# Patient Record
Sex: Male | Born: 1960 | Race: White | Hispanic: No | Marital: Married | Smoking: Never smoker
Health system: Southern US, Community
[De-identification: ages and names within clinical notes are randomized; demographics above are authoritative.]

## PROBLEM LIST (undated history)

## (undated) HISTORY — PX: LEG SURGERY: SHX1003

---

## 1997-12-12 ENCOUNTER — Ambulatory Visit (HOSPITAL_BASED_OUTPATIENT_CLINIC_OR_DEPARTMENT_OTHER): Admission: RE | Admit: 1997-12-12 | Discharge: 1997-12-12 | Payer: Self-pay | Admitting: *Deleted

## 2001-04-08 ENCOUNTER — Emergency Department (HOSPITAL_COMMUNITY): Admission: EM | Admit: 2001-04-08 | Discharge: 2001-04-09 | Payer: Self-pay | Admitting: Emergency Medicine

## 2001-04-09 ENCOUNTER — Encounter: Payer: Self-pay | Admitting: Emergency Medicine

## 2007-07-31 ENCOUNTER — Emergency Department (HOSPITAL_COMMUNITY): Admission: EM | Admit: 2007-07-31 | Discharge: 2007-07-31 | Payer: Self-pay | Admitting: Emergency Medicine

## 2007-11-19 ENCOUNTER — Emergency Department (HOSPITAL_COMMUNITY): Admission: EM | Admit: 2007-11-19 | Discharge: 2007-11-19 | Payer: Self-pay | Admitting: Emergency Medicine

## 2008-12-02 ENCOUNTER — Emergency Department (HOSPITAL_BASED_OUTPATIENT_CLINIC_OR_DEPARTMENT_OTHER): Admission: EM | Admit: 2008-12-02 | Discharge: 2008-12-02 | Payer: Self-pay | Admitting: Emergency Medicine

## 2010-11-05 ENCOUNTER — Emergency Department (HOSPITAL_BASED_OUTPATIENT_CLINIC_OR_DEPARTMENT_OTHER)
Admission: EM | Admit: 2010-11-05 | Discharge: 2010-11-05 | Disposition: A | Discharge status: Home / Self Care | Payer: Self-pay | Attending: Emergency Medicine | Admitting: Emergency Medicine

## 2010-11-05 ENCOUNTER — Encounter: Payer: Self-pay | Admitting: Family Medicine

## 2010-11-05 DIAGNOSIS — R21 Rash and other nonspecific skin eruption: Secondary | ICD-10-CM | POA: Insufficient documentation

## 2010-11-05 MED ORDER — PREDNISONE 20 MG PO TABS: 40.0000 mg | ORAL_TABLET | Freq: Every day | ORAL | Status: AC

## 2010-11-05 NOTE — ED Provider Notes (Signed)
History     CSN: [id number] Arrival date & time: 11/05/2010  5:48 PM   First MD Initiated Contact with Patient 11/05/10 1737      Chief Complaint  Patient presents with  . Rash    (Consider location/radiation/quality/duration/timing/severity/associated sxs/prior treatment) HPI Comments: Pt states that he has had the areas to the chest and arms:pt states that most of the area has gotten better the next day after benadryl, but it keeps pooping up  Patient is a 50 y.o. male presenting with rash. The history is provided by the patient. No language interpreter was used.  Rash  This is a new problem. There has been no fever.    History reviewed. No pertinent past medical history.  Past Surgical History  Procedure Date  . Leg surgery     No family history on file.  History  Substance Use Topics  . Smoking status: Never Smoker   . Smokeless tobacco: Not on file  . Alcohol Use: No      Review of Systems  Constitutional: Negative.   HENT: Negative.   Respiratory: Negative.   Cardiovascular: Negative.   Musculoskeletal: Negative.   Skin: Positive for rash.  Neurological: Negative.   All other systems reviewed and are negative.    Allergies  Review of patient's allergies indicates no known allergies.  Home Medications   Current Outpatient Rx  Name Route Sig Dispense Refill  . DIPHENHYDRAMINE HCL 25 MG PO TABS Oral Take 50 mg by mouth at bedtime as needed. For itching       There were no vitals taken for this visit.  Physical Exam  Nursing note and vitals reviewed. Constitutional: He appears well-developed and well-nourished.  HENT:  Head: Normocephalic and atraumatic.  Right Ear: External ear normal.  Left Ear: External ear normal.  Mouth/Throat: Oropharynx is clear and moist.  Cardiovascular: Normal rate and regular rhythm.   Pulmonary/Chest: Effort normal and breath sounds normal.  Musculoskeletal: Normal range of motion.  Neurological: He is alert.    Skin:       Pt has red raised papules to the left forearm:no drainage noted  Psychiatric: He has a normal mood and affect.    ED Course  Procedures (including critical care time)  Labs Reviewed - No data to display No results found.   1. Rash       MDM  Pt denies exposure to ticks:discussed with pt that like a reaction to something that he was exposed to with hay:pt has had a cold recently, no concern for stevens Jacobo Forest, NP 11/05/10 1811

## 2010-11-05 NOTE — ED Provider Notes (Signed)
Medical screening examination/treatment/procedure(s) were performed by non-physician practitioner and as supervising physician I was immediately available for consultation/collaboration.  Ethelda Chick, MD 11/05/10 [phone]

## 2010-11-05 NOTE — ED Notes (Signed)
Pt. Has noted red rash with some raised bumps on the arms (inner) and the abd. Per Pt.   Red rash assessed and he reports itching.

## 2010-11-05 NOTE — ED Notes (Signed)
Pt c/o rash to bilateral arms and chest "off and on" since Sunday.

## 2010-11-05 NOTE — ED Notes (Signed)
Pt. Seen by NP and will be discharged

## 2018-10-15 ENCOUNTER — Encounter (HOSPITAL_BASED_OUTPATIENT_CLINIC_OR_DEPARTMENT_OTHER): Payer: Self-pay | Admitting: Emergency Medicine

## 2018-10-15 ENCOUNTER — Emergency Department (HOSPITAL_BASED_OUTPATIENT_CLINIC_OR_DEPARTMENT_OTHER)
Admission: EM | Admit: 2018-10-15 | Discharge: 2018-10-15 | Disposition: A | Discharge status: Home / Self Care | Payer: Self-pay | Attending: Emergency Medicine | Admitting: Emergency Medicine

## 2018-10-15 ENCOUNTER — Other Ambulatory Visit: Payer: Self-pay

## 2018-10-15 ENCOUNTER — Emergency Department (HOSPITAL_BASED_OUTPATIENT_CLINIC_OR_DEPARTMENT_OTHER): Payer: Self-pay

## 2018-10-15 DIAGNOSIS — W450XXA Nail entering through skin, initial encounter: Secondary | ICD-10-CM | POA: Insufficient documentation

## 2018-10-15 DIAGNOSIS — Y999 Unspecified external cause status: Secondary | ICD-10-CM | POA: Insufficient documentation

## 2018-10-15 DIAGNOSIS — S61041A Puncture wound with foreign body of right thumb without damage to nail, initial encounter: Secondary | ICD-10-CM | POA: Insufficient documentation

## 2018-10-15 DIAGNOSIS — Z23 Encounter for immunization: Secondary | ICD-10-CM | POA: Insufficient documentation

## 2018-10-15 DIAGNOSIS — Y929 Unspecified place or not applicable: Secondary | ICD-10-CM | POA: Insufficient documentation

## 2018-10-15 DIAGNOSIS — S60351A Superficial foreign body of right thumb, initial encounter: Secondary | ICD-10-CM

## 2018-10-15 DIAGNOSIS — Y93H9 Activity, other involving exterior property and land maintenance, building and construction: Secondary | ICD-10-CM | POA: Insufficient documentation

## 2018-10-15 MED ORDER — TETANUS-DIPHTH-ACELL PERTUSSIS 5-2.5-18.5 LF-MCG/0.5 IM SUSP
0.5000 mL | Freq: Once | INTRAMUSCULAR | Status: AC
  Administered 2018-10-15: 0.5 mL via INTRAMUSCULAR
  Filled 2018-10-15: qty 0.5

## 2018-10-15 MED ORDER — BACITRACIN ZINC 500 UNIT/GM EX OINT
TOPICAL_OINTMENT | Freq: Once | CUTANEOUS | Status: AC
  Administered 2018-10-15: 1 via TOPICAL
  Filled 2018-10-15: qty 28.35

## 2018-10-15 MED ORDER — CEPHALEXIN 250 MG PO CAPS
500.0000 mg | ORAL_CAPSULE | Freq: Once | ORAL | Status: AC
  Administered 2018-10-15: 16:00:00 500 mg via ORAL
  Filled 2018-10-15: qty 2

## 2018-10-15 MED ORDER — CEPHALEXIN 500 MG PO CAPS: 500.0000 mg | ORAL_CAPSULE | Freq: Four times a day (QID) | ORAL | 0 refills | Status: AC

## 2018-10-15 MED ORDER — LIDOCAINE HCL (PF) 1 % IJ SOLN
20.0000 mL | Freq: Once | INTRAMUSCULAR | Status: AC
  Administered 2018-10-15: 10 mL
  Filled 2018-10-15: qty 20

## 2018-10-15 NOTE — ED Triage Notes (Addendum)
Pt has large nail through R thumb

## 2018-10-15 NOTE — ED Provider Notes (Signed)
[facility] EMERGENCY DEPARTMENT Provider Note   CSN: [id number] Arrival date & time: 10/15/18  1340     History   Chief Complaint Chief Complaint  Patient presents with  . Foreign Body in Skin    HPI Zachary Weaver is a right hand dominant 58 y.o. male past medical history as listed below presents emergency department today with chief complaint of nail through right thumb.  Onset was acute happening just prior to arrival.  Patient states he was using a nail gun to put up some boards when it accidentally went through his right thumb.  He reports pain localized to the wound that he describes as a dull ache.  He rates the pain 4 out of 10 in severity.  He did not take anything for pain prior to arrival.  Denies fever, chills, numbness, tingling, weakness, wrist pain, elbow pain, fall, LOC. Tetanus immunization is not up-to-date.  History reviewed. No pertinent past medical history.  There are no active problems to display for this patient.   Past Surgical History:  Procedure Laterality Date  . LEG SURGERY          Home Medications    Prior to Admission medications   Medication Sig Start Date End Date Taking? Authorizing Provider  cephALEXin (KEFLEX) 500 MG capsule Take 1 capsule (500 mg total) by mouth 4 (four) times daily for 7 days. 10/15/18 10/22/18  Albrizze, Kaitlyn E, PA-C  diphenhydrAMINE (BENADRYL) 25 MG tablet Take 50 mg by mouth at bedtime as needed. For itching     [provider]    Family History No family history on file.  Social History Social History   Tobacco Use  . Smoking status: Never Smoker  . Smokeless tobacco: Never Used  Substance Use Topics  . Alcohol use: No  . Drug use: No     Allergies   Patient has no known allergies.   Review of Systems Review of Systems  Constitutional: Negative for chills and fever.  HENT: Negative for congestion, rhinorrhea, sinus pressure and sore throat.   Eyes: Negative for pain  and redness.  Respiratory: Negative for cough, shortness of breath and wheezing.   Cardiovascular: Negative for chest pain and palpitations.  Gastrointestinal: Negative for abdominal pain, constipation, diarrhea, nausea and vomiting.  Genitourinary: Negative for dysuria.  Musculoskeletal: Negative for arthralgias, back pain, myalgias and neck pain.  Skin: Positive for wound. Negative for rash.  Neurological: Negative for dizziness, syncope, weakness, numbness and headaches.  Psychiatric/Behavioral: Negative for confusion.     Physical Exam Updated Vital Signs BP 134/81 (BP Location: Left Arm)   Pulse 60   Temp 98.2 F (36.8 C) (Oral)   Resp 18   Ht 5\' 11"  (1.803 m)   Wt 79.4 kg   SpO2 97%   BMI 24.41 kg/m   Physical Exam Vitals signs and nursing note reviewed.  Constitutional:      General: He is not in acute distress.    Appearance: He is not ill-appearing.  HENT:     Head: Normocephalic and atraumatic.     Right Ear: Tympanic membrane and external ear normal.     Left Ear: Tympanic membrane and external ear normal.     Nose: Nose normal.     Mouth/Throat:     Mouth: Mucous membranes are moist.     Pharynx: Oropharynx is clear.  Eyes:     General: No scleral icterus.       Right eye: No discharge.  Left eye: No discharge.     Extraocular Movements: Extraocular movements intact.     Conjunctiva/sclera: Conjunctivae normal.     Pupils: Pupils are equal, round, and reactive to light.  Neck:     Musculoskeletal: Normal range of motion.     Vascular: No JVD.  Cardiovascular:     Rate and Rhythm: Normal rate and regular rhythm.     Pulses: Normal pulses.          Radial pulses are 2+ on the right side and 2+ on the left side.     Heart sounds: Normal heart sounds.  Pulmonary:     Comments: Lungs clear to auscultation in all fields. Symmetric chest rise. No wheezing, rales, or rhonchi. Abdominal:     Comments: Abdomen is soft, non-distended, and non-tender in  all quadrants. No rigidity, no guarding. No peritoneal signs.  Musculoskeletal: Normal range of motion.     Comments: Full range of motion of right wrist and right elbow.  Patient is neurovascularly intact above and below affected joint.  Compartments are soft.  Skin:    General: Skin is warm and dry.     Capillary Refill: Capillary refill takes less than 2 seconds.     Comments: Large metal nail penetrates the medial aspect of the right  thumb soft tissues.  No bleeding.  Patient has full range of motion of right thumb.  Sensation is intact.  Strength and resistance of right thumb intact.  Neurological:     Mental Status: He is oriented to person, place, and time.     GCS: GCS eye subscore is 4. GCS verbal subscore is 5. GCS motor subscore is 6.     Comments: Fluent speech, no facial droop.  Psychiatric:        Behavior: Behavior normal.      ED Treatments / Results  Labs (all labs ordered are listed, but only abnormal results are displayed) Labs Reviewed - No data to display  EKG None  Radiology Dg Finger Thumb Right  Result Date: 10/15/2018 CLINICAL DATA:  Nail gun 3 thumb EXAM: RIGHT THUMB 2+V COMPARISON:  None. FINDINGS: No fracture or dislocation of the right thumb. A nail penetrates the medial aspect of the thumb soft tissues and closely abuts but does not appear to penetrate or fracture the distal aspect of the thumb proximal phalanx. No other radiopaque foreign body identified. 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 IMPRESSION: No fracture or dislocation of the right thumb. A nail penetrates the medial aspect of the thumb soft tissues and closely abuts but does not appear to penetrate or fracture the distal aspect of the thumb proximal phalanx. No other radiopaque foreign body identified. Electronically Signed   By: Lauralyn PrimesAlex  Bibbey M.D.   On: 10/15/2018 14:57    Procedures .Foreign Body Removal  Date/Time: 10/15/2018 3:47 PM Performed by: Sherene SiresAlbrizze, Kaitlyn  E, PA-C Authorized by: Sherene SiresAlbrizze, Kaitlyn E, PA-C  Consent: Verbal consent obtained. Risks and benefits: risks, benefits and alternatives were discussed Consent given by: patient Patient understanding: patient states understanding of the procedure being performed Imaging studies: imaging studies available Patient identity confirmed: verbally with patient and arm band Time out: Immediately prior to procedure a "time out" was called to verify the correct patient, procedure, equipment, support staff and site/side marked as required. Body area: skin General location: upper extremity Location details: right thumb Anesthesia: local infiltration  Anesthesia: Local Anesthetic: lidocaine 1% without epinephrine Anesthetic total: 15 mL  Sedation: Patient sedated:  no  Patient restrained: no Patient cooperative: yes Dressing: dressing applied Depth: deep Complexity: simple 1 objects recovered. Objects recovered: metal nail Post-procedure assessment: foreign body removed Patient tolerance of procedure: Pt with brief vasovagal episode after nail removed. No head injury, no fall.   (including critical care time)  Medications Ordered in ED Medications  Tdap (BOOSTRIX) injection 0.5 mL (0.5 mLs Intramuscular Given 10/15/18 1515)  lidocaine (PF) (XYLOCAINE) 1 % injection 20 mL (10 mLs Infiltration Given by Other 10/15/18 1519)  bacitracin ointment (1 application Topical Given 10/15/18 1617)  cephALEXin (KEFLEX) capsule 500 mg (500 mg Oral Given 10/15/18 1618)     Initial Impression / Assessment and Plan / ED Course  I have reviewed the triage vital signs and the nursing notes.  Pertinent labs & imaging results that were available during my care of the patient were reviewed by me and considered in my medical decision making (see chart for details).  Patient seen and examined. Patient nontoxic appearing, in no apparent distress, vitals WNL.  There is a large nail through the dorsal aspect of  right thumb proximal phalangeal.  Patient has full range of motion of right thumb as well as right wrist and elbow.  Radial pulses 2+ bilaterally.  Brisk cap refill.  X-ray of right thumb viewed by me shows no fracture or dislocation of the right thumb. The nail penetrates the medial aspect of the thumb soft tissues. Tetanus updated at today's visit.  Foreign body removed as documented above, please see procedure note.  Extensive irrigation performed. Pt with vasovagal response upon nail being removed. He was seated in chair and did not fall or hit his head. He quickly came too, soda given.  Patient states he has a history of vasovagal after injuries.  I frequently reassessed patient and he continues to be feeling well.  No lightheadedness.  I ambulated him and he did so with steady gait.  Feel that he is safe to be discharged home.  Wound care discussed.  Will start on antibiotics.  I discussed results, treatment plan, need for follow-up, and return precautions with the patient including signs of infection. Provided opportunity for questions, patient confirmed understanding and is in agreement with plan. The patient was discussed with and seen by Dr. Gilford Raid who agrees with the treatment plan.  Portions of this note were generated with [employer]. Dictation errors may occur despite best attempts at proofreading.  Final Clinical Impressions(s) / ED Diagnoses   Final diagnoses:  Acute foreign body of right thumb, initial encounter    ED Discharge Orders         Ordered    cephALEXin (KEFLEX) 500 MG capsule  4 times daily     10/15/18 1552           Cherre Robins, PA-C 10/15/18 1714    Isla Pence, MD 10/15/18 [phone]

## 2018-10-15 NOTE — ED Notes (Signed)
Pt advised RX has been electronically prescribed to pharmacy on d/c instructions

## 2018-10-15 NOTE — Discharge Instructions (Addendum)
You have been seen today for nail in thumb. Please read and follow all provided instructions. Return to the emergency room for worsening condition or new concerning symptoms including fever, pus draining, redness or streaking at the site of the wound.  1. Medications:  Prescription to your pharmacy for Keflex.  This is an antibiotic.  Please take as prescribed. Continue usual home medications Take medications as prescribed. Please review all of the medicines and only take them if you do not have an allergy to them.   2. Treatment: rest, drink plenty of fluids -Keep the thumb dry for the first 24 hours.  After that please wash daily with soap and water.  Wear a bandage and use antibiotic ointment anytime you are using your hands and bacteria could possibly get in the wound.  3. Follow Up: Please follow up with your primary doctor in 2 days for wound check.  It is also a possibility that you have an allergic reaction to any of the medicines that you have been prescribed - Everybody reacts differently to medications and while MOST people have no trouble with most medicines, you may have a reaction such as nausea, vomiting, rash, swelling, shortness of breath. If this is the case, please stop taking the medicine immediately and contact your physician.  ?

## 2018-10-15 NOTE — ED Notes (Signed)
ED Provider at bedside. 

## 2020-04-27 IMAGING — CR DG FINGER THUMB 2+V*R*
3 series · 3 of 3 positions shown · non-contrast
Comparison: None.

CLINICAL DATA: Nail gun 3 thumb

EXAM:
RIGHT THUMB 2+V

[x finger obl. right]
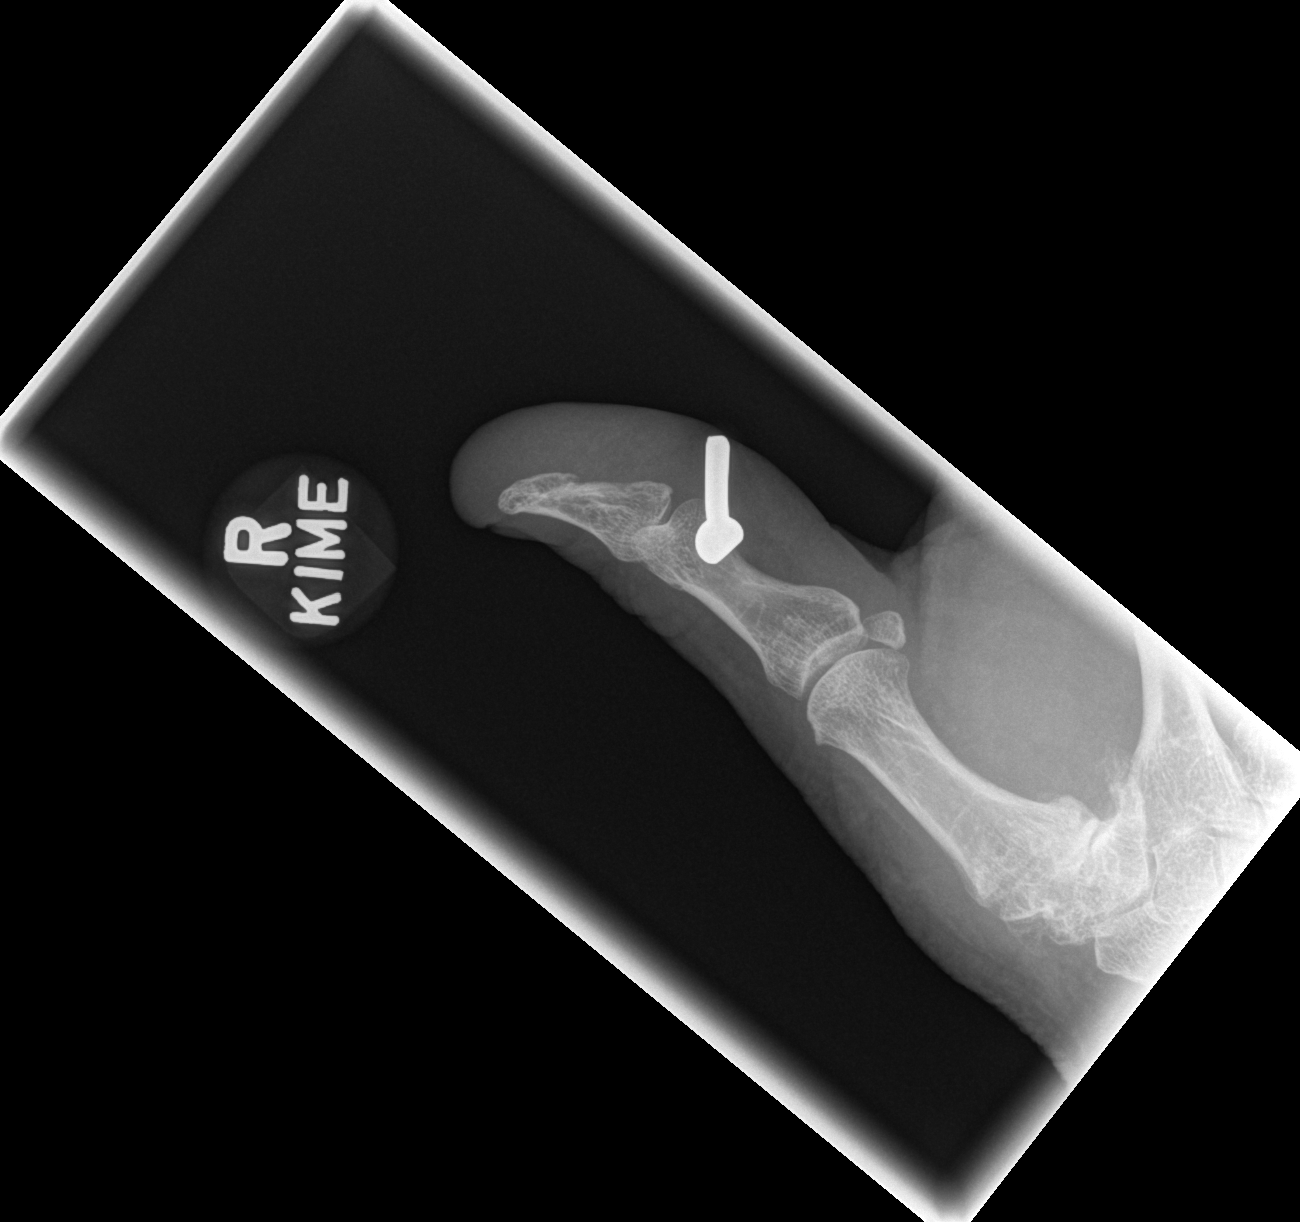

[x finger lateral right]
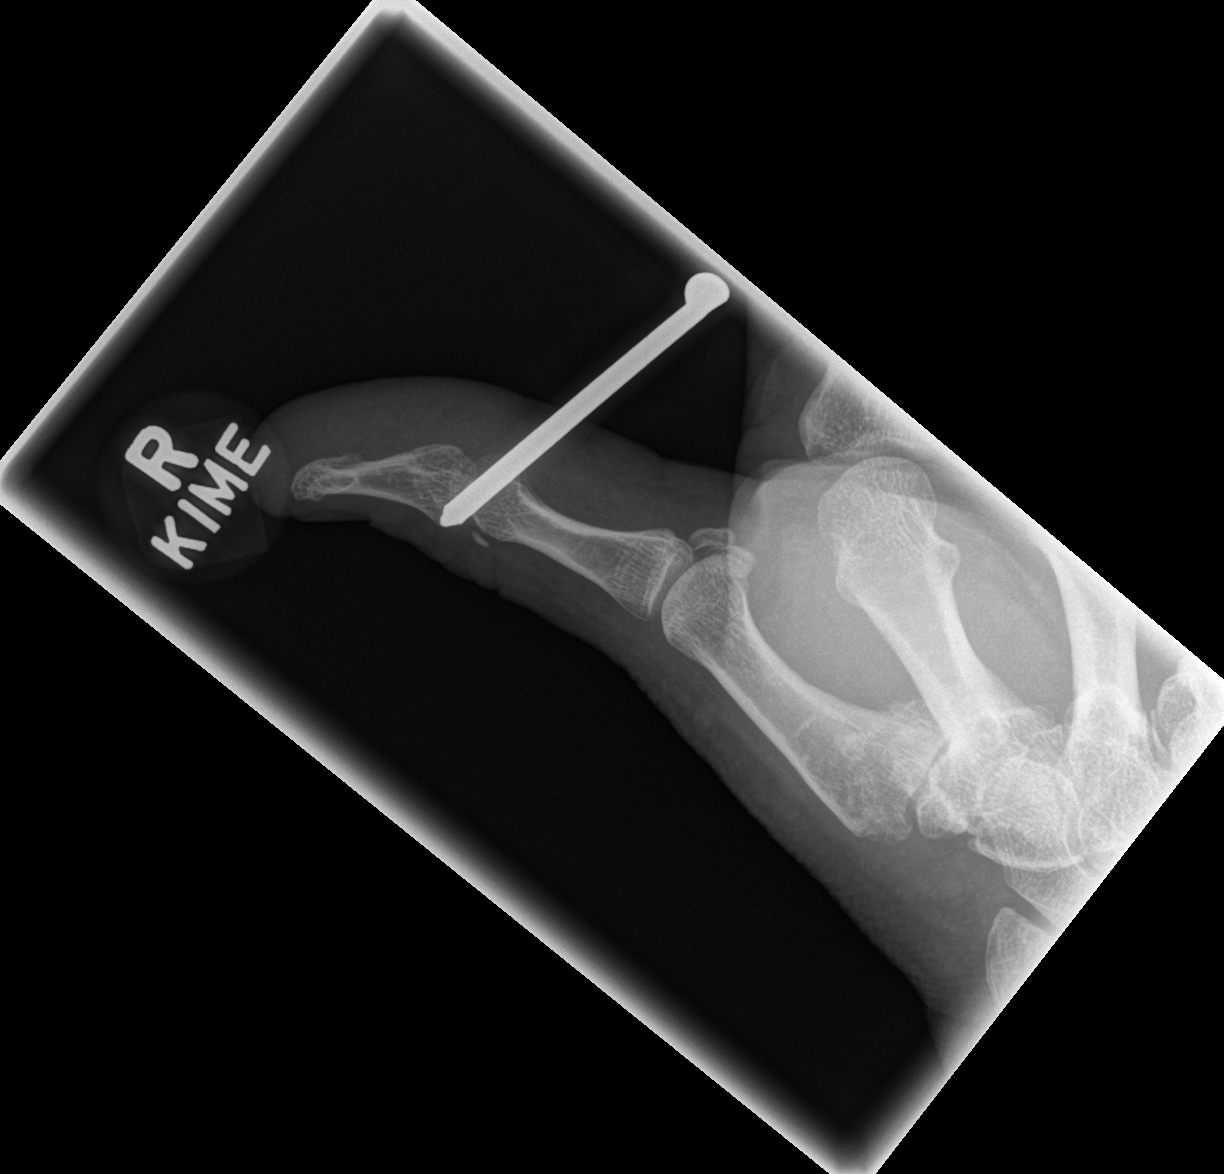

[x finger pa right]
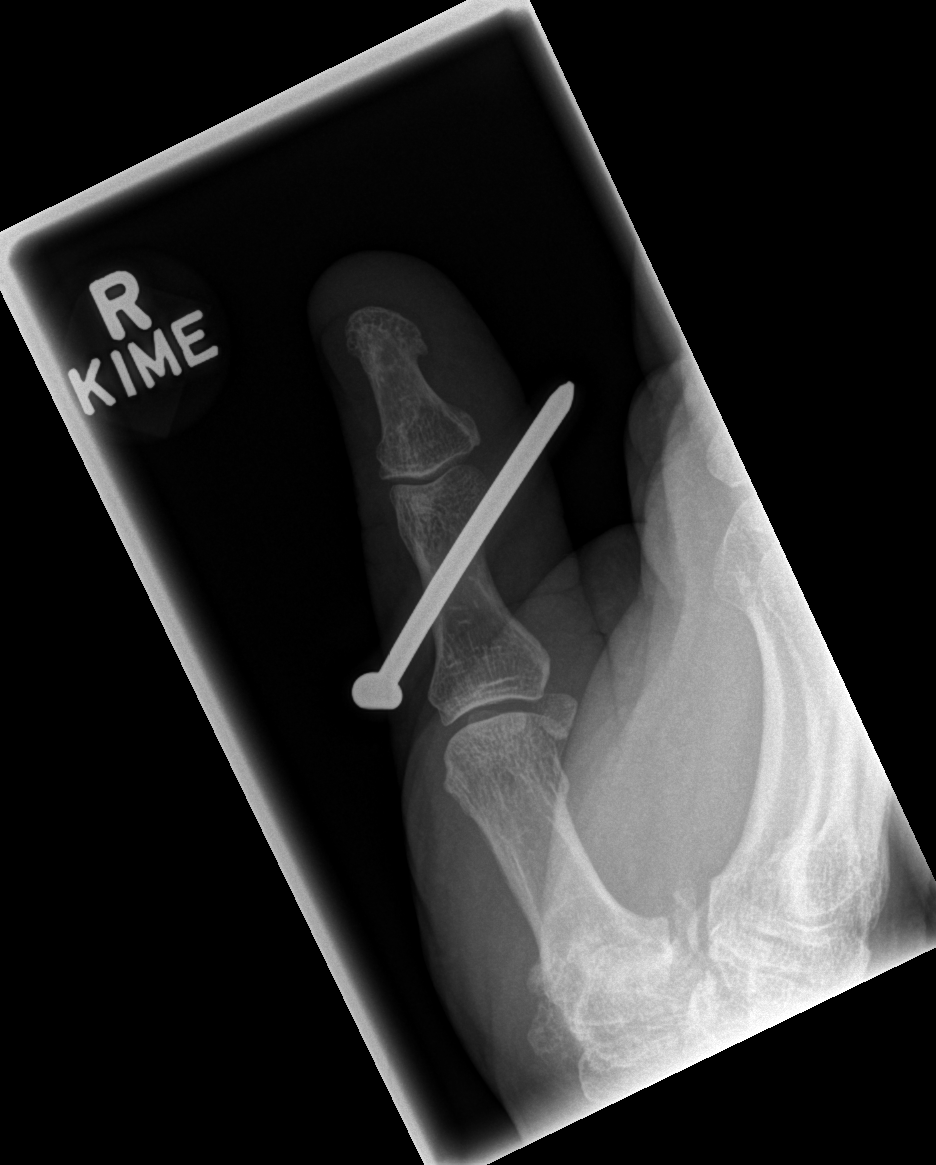

[3 of 3 positions shown; findings below may reference images not displayed]

FINDINGS: No fracture or dislocation of the right thumb. A nail penetrates the
medial aspect of the thumb soft tissues and closely abuts but does
not appear to penetrate or fracture the distal aspect of the thumb
proximal phalanx. No other radiopaque foreign body identified. Very
severe arthrosis of the thumb carpometacarpal joint.
IMPRESSION: No fracture or dislocation of the right thumb. A nail penetrates the
medial aspect of the thumb soft tissues and closely abuts but does
not appear to penetrate or fracture the distal aspect of the thumb
proximal phalanx. No other radiopaque foreign body identified.

## 2023-09-06 ENCOUNTER — Other Ambulatory Visit: Payer: Self-pay | Admitting: Urology

## 2023-09-06 DIAGNOSIS — R972 Elevated prostate specific antigen [PSA]: Secondary | ICD-10-CM

## 2023-09-07 ENCOUNTER — Encounter: Payer: Self-pay | Admitting: Urology

## 2023-10-02 ENCOUNTER — Ambulatory Visit
Admission: RE | Admit: 2023-10-02 | Discharge: 2023-10-02 | Disposition: A | Discharge status: Home / Self Care | Source: Ambulatory Visit | Attending: Urology | Admitting: Urology

## 2023-10-02 DIAGNOSIS — R972 Elevated prostate specific antigen [PSA]: Secondary | ICD-10-CM

## 2023-10-02 MED ORDER — GADOPICLENOL 0.5 MMOL/ML IV SOLN
9.0000 mL | Freq: Once | INTRAVENOUS | Status: AC | PRN
  Administered 2023-10-02: 9 mL via INTRAVENOUS
